# Patient Record
Sex: Female | Born: 1980
Health system: Southern US, Community
[De-identification: ages and names within clinical notes are randomized; demographics above are authoritative.]

---

## 2013-04-04 ENCOUNTER — Other Ambulatory Visit: Payer: Self-pay | Admitting: Chiropractic Medicine

## 2013-04-04 ENCOUNTER — Ambulatory Visit
Admission: RE | Admit: 2013-04-04 | Discharge: 2013-04-04 | Disposition: A | Discharge status: Home / Self Care | Payer: BC Managed Care – PPO | Source: Ambulatory Visit | Attending: Chiropractic Medicine | Admitting: Chiropractic Medicine

## 2013-04-04 DIAGNOSIS — M545 Low back pain, unspecified: Secondary | ICD-10-CM

## 2013-04-04 DIAGNOSIS — M5380 Other specified dorsopathies, site unspecified: Secondary | ICD-10-CM

## 2013-04-04 DIAGNOSIS — M542 Cervicalgia: Secondary | ICD-10-CM

## 2014-03-02 IMAGING — CR DG LUMBAR SPINE 2-3V
3 series · 3 of 3 positions shown · non-contrast
Comparison: None.

CLINICAL DATA: Left low back pain

LUMBAR SPINE - 2-3 VIEW

[view not recorded (1 of 3)]
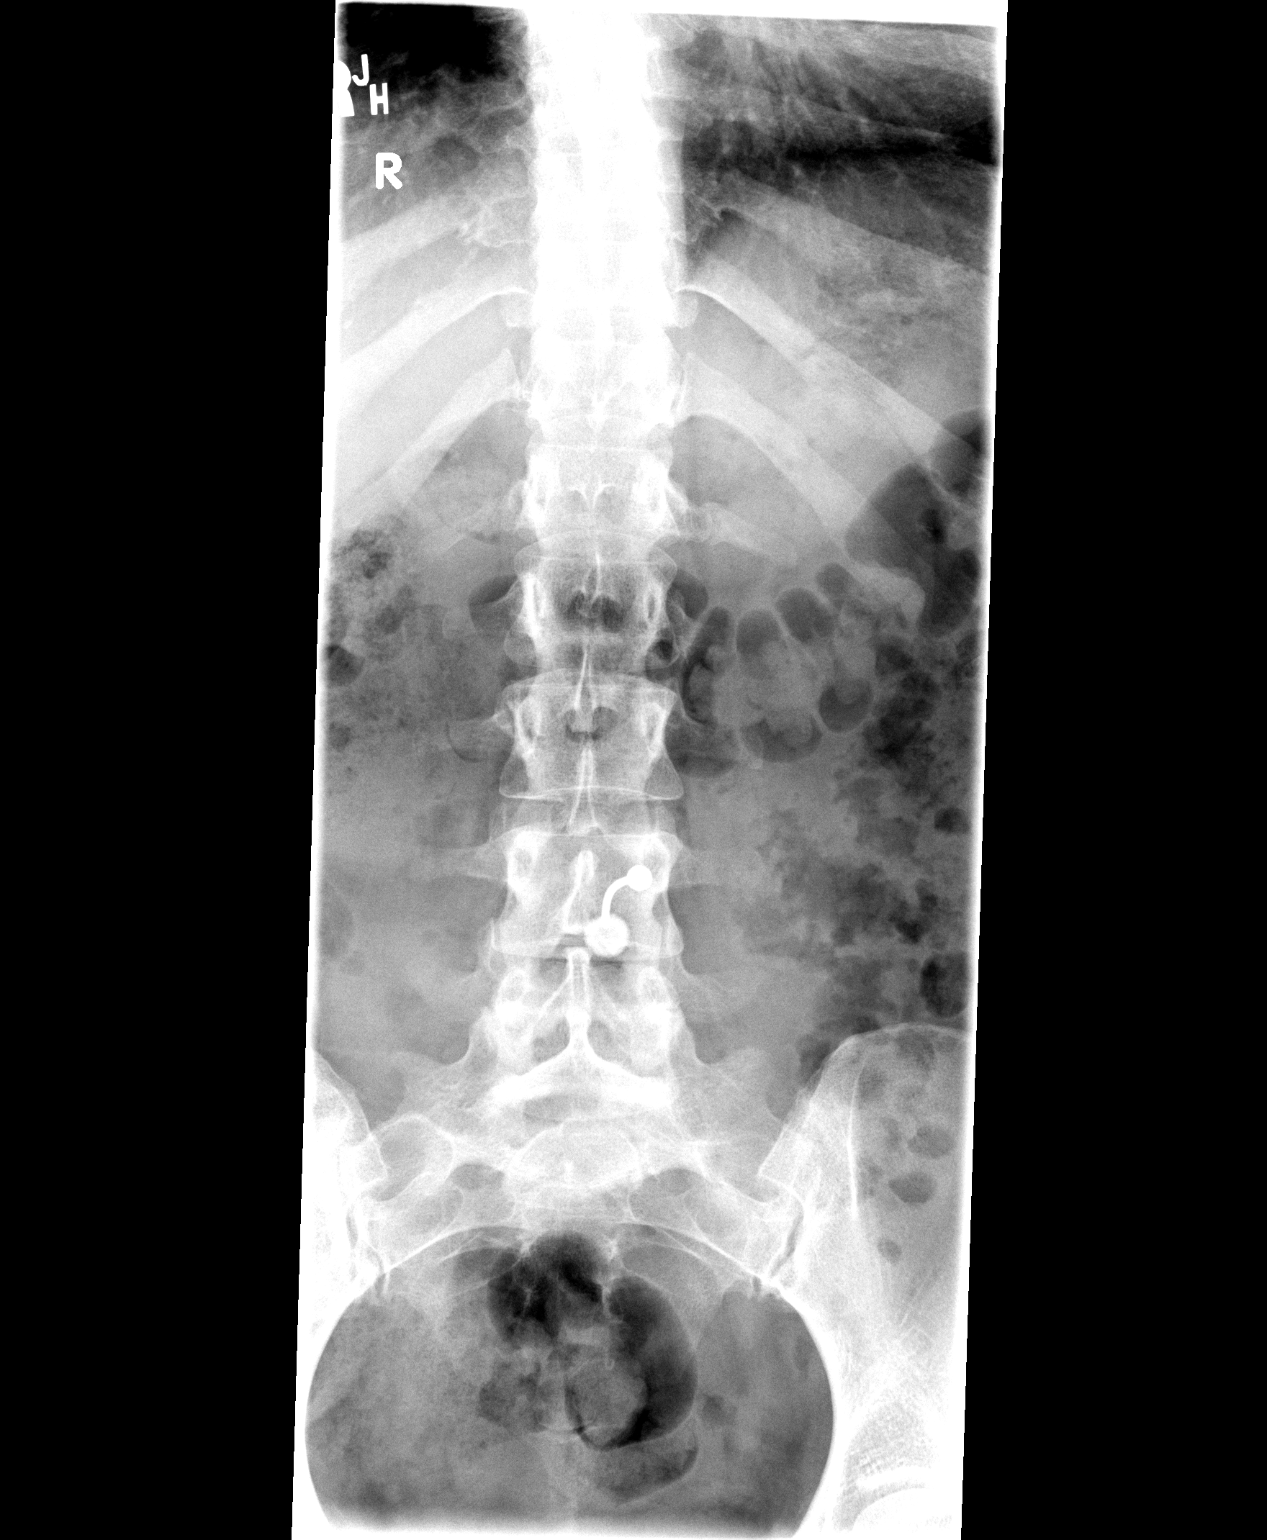

[view not recorded (2 of 3)]
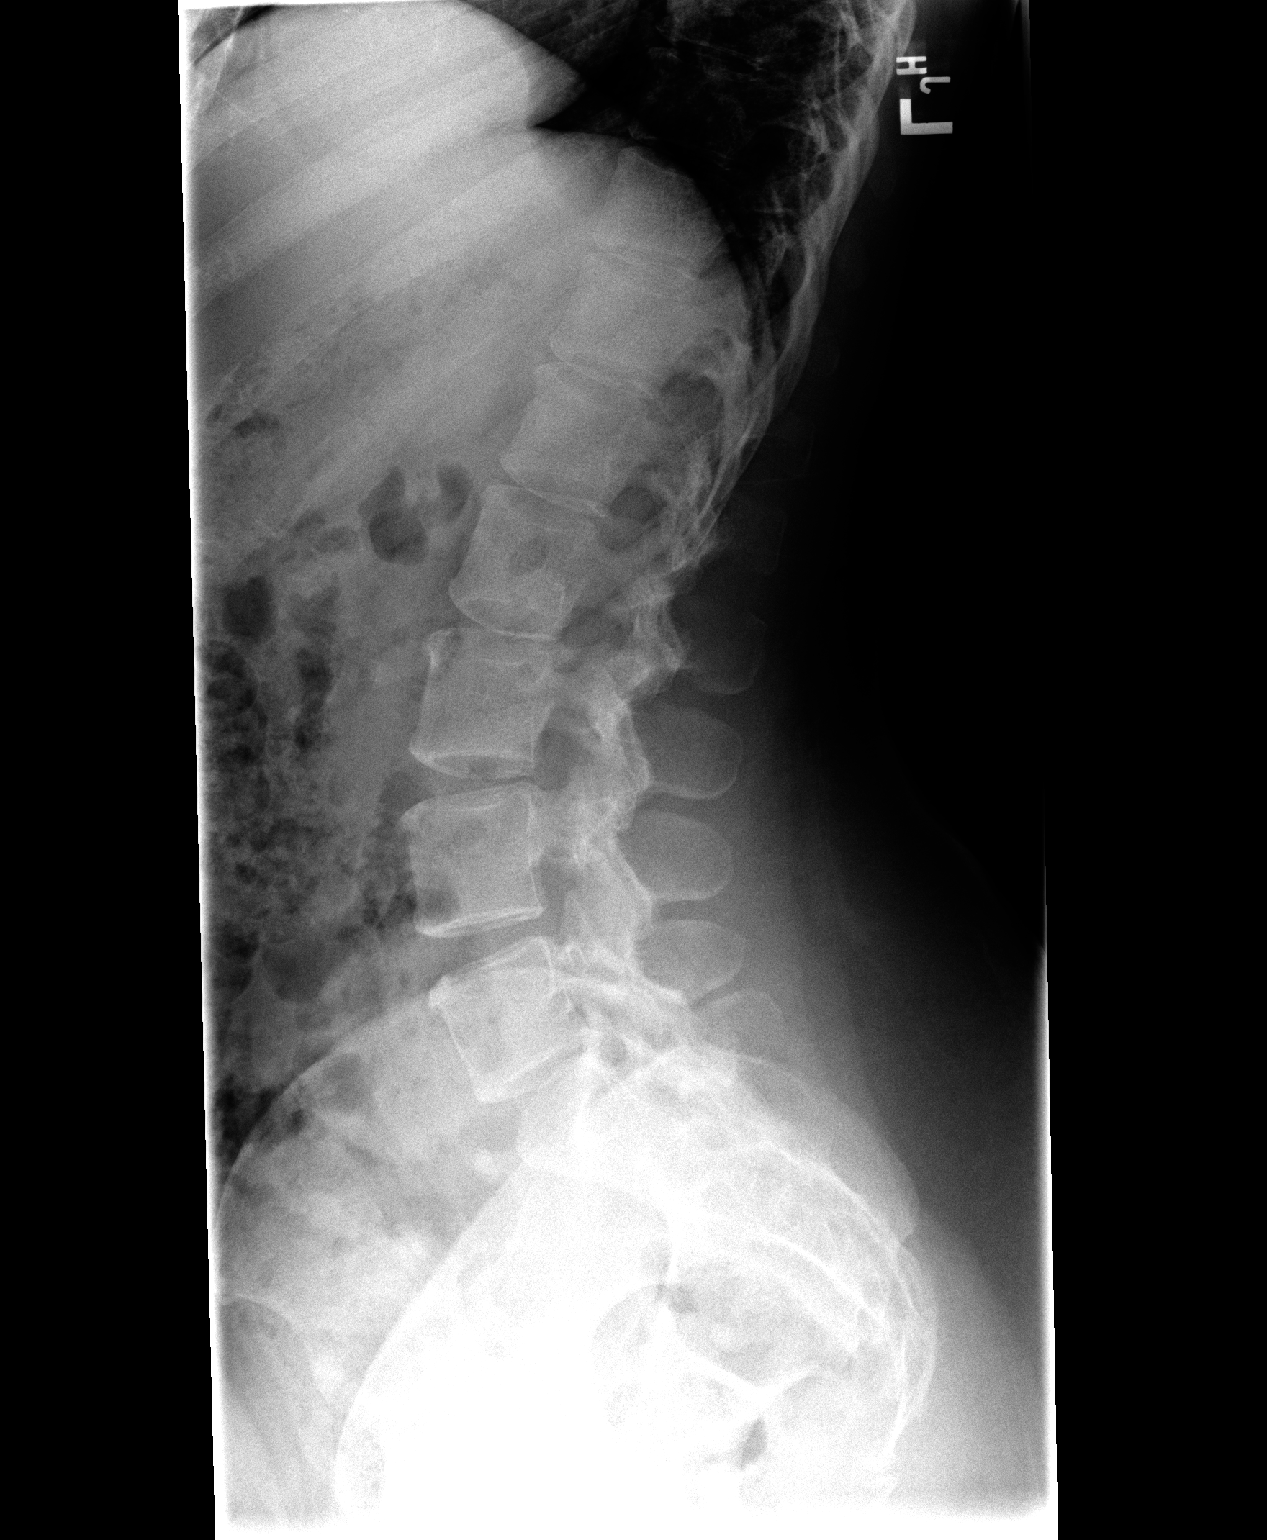

[view not recorded (3 of 3)]
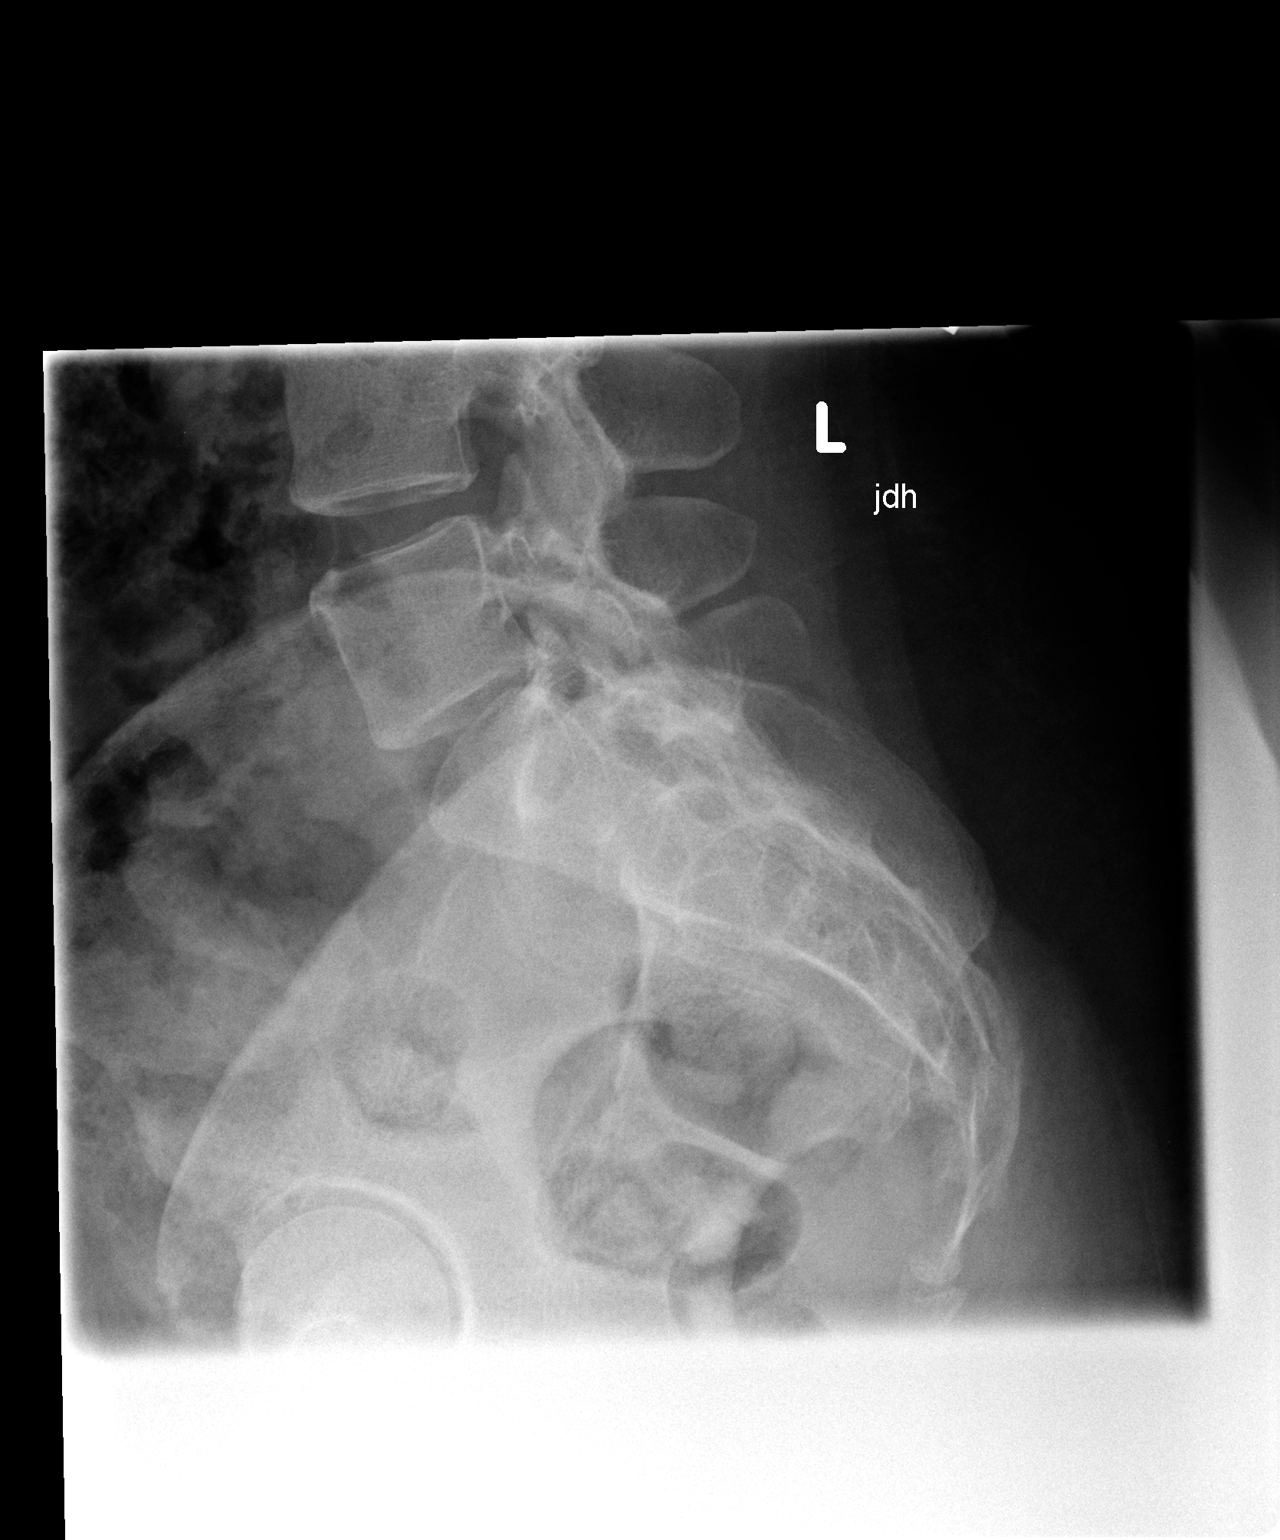

[3 of 3 positions shown; findings below may reference images not displayed]

FINDINGS: Alignment is normal.  There is slightly exaggerated
lordosis.  Disc space heights are normal.  No facet arthropathy is
evident.  Sacroiliac joints appear normal.
IMPRESSION: No pathologic finding.  Slightly exaggerated lordosis.

## 2016-06-06 DIAGNOSIS — Z713 Dietary counseling and surveillance: Secondary | ICD-10-CM | POA: Diagnosis not present

## 2016-06-24 DIAGNOSIS — E559 Vitamin D deficiency, unspecified: Secondary | ICD-10-CM | POA: Diagnosis not present

## 2016-07-03 DIAGNOSIS — Z713 Dietary counseling and surveillance: Secondary | ICD-10-CM | POA: Diagnosis not present

## 2016-07-08 DIAGNOSIS — G5603 Carpal tunnel syndrome, bilateral upper limbs: Secondary | ICD-10-CM | POA: Diagnosis not present

## 2016-07-08 DIAGNOSIS — J309 Allergic rhinitis, unspecified: Secondary | ICD-10-CM | POA: Diagnosis not present

## 2016-07-08 DIAGNOSIS — E559 Vitamin D deficiency, unspecified: Secondary | ICD-10-CM | POA: Diagnosis not present

## 2016-07-08 DIAGNOSIS — E663 Overweight: Secondary | ICD-10-CM | POA: Diagnosis not present

## 2016-08-12 DIAGNOSIS — Z6829 Body mass index (BMI) 29.0-29.9, adult: Secondary | ICD-10-CM | POA: Diagnosis not present

## 2016-08-12 DIAGNOSIS — Z01419 Encounter for gynecological examination (general) (routine) without abnormal findings: Secondary | ICD-10-CM | POA: Diagnosis not present

## 2016-12-10 DIAGNOSIS — Z0000 Encounter for general adult medical examination without abnormal findings: Secondary | ICD-10-CM | POA: Diagnosis not present

## 2016-12-10 DIAGNOSIS — Z136 Encounter for screening for cardiovascular disorders: Secondary | ICD-10-CM | POA: Diagnosis not present

## 2016-12-10 DIAGNOSIS — E559 Vitamin D deficiency, unspecified: Secondary | ICD-10-CM | POA: Diagnosis not present

## 2017-02-05 DIAGNOSIS — Z6831 Body mass index (BMI) 31.0-31.9, adult: Secondary | ICD-10-CM | POA: Diagnosis not present

## 2017-02-05 DIAGNOSIS — Z23 Encounter for immunization: Secondary | ICD-10-CM | POA: Diagnosis not present

## 2017-02-05 DIAGNOSIS — Z0000 Encounter for general adult medical examination without abnormal findings: Secondary | ICD-10-CM | POA: Diagnosis not present

## 2017-03-06 DIAGNOSIS — D224 Melanocytic nevi of scalp and neck: Secondary | ICD-10-CM | POA: Diagnosis not present

## 2017-03-06 DIAGNOSIS — D2261 Melanocytic nevi of right upper limb, including shoulder: Secondary | ICD-10-CM | POA: Diagnosis not present

## 2017-03-06 DIAGNOSIS — D2262 Melanocytic nevi of left upper limb, including shoulder: Secondary | ICD-10-CM | POA: Diagnosis not present

## 2017-03-06 DIAGNOSIS — D225 Melanocytic nevi of trunk: Secondary | ICD-10-CM | POA: Diagnosis not present

## 2017-08-12 DIAGNOSIS — Z23 Encounter for immunization: Secondary | ICD-10-CM | POA: Diagnosis not present

## 2017-08-27 DIAGNOSIS — Z23 Encounter for immunization: Secondary | ICD-10-CM | POA: Diagnosis not present

## 2017-10-26 DIAGNOSIS — Z6829 Body mass index (BMI) 29.0-29.9, adult: Secondary | ICD-10-CM | POA: Diagnosis not present

## 2017-10-26 DIAGNOSIS — Z01419 Encounter for gynecological examination (general) (routine) without abnormal findings: Secondary | ICD-10-CM | POA: Diagnosis not present

## 2018-01-29 ENCOUNTER — Other Ambulatory Visit: Payer: Self-pay

## 2018-01-29 ENCOUNTER — Ambulatory Visit (HOSPITAL_COMMUNITY)
Admission: EM | Admit: 2018-01-29 | Discharge: 2018-01-29 | Disposition: A | Discharge status: Home / Self Care | Payer: BLUE CROSS/BLUE SHIELD | Attending: Internal Medicine | Admitting: Internal Medicine

## 2018-01-29 ENCOUNTER — Encounter (HOSPITAL_COMMUNITY): Payer: Self-pay | Admitting: Emergency Medicine

## 2018-01-29 DIAGNOSIS — J029 Acute pharyngitis, unspecified: Secondary | ICD-10-CM | POA: Diagnosis not present

## 2018-01-29 DIAGNOSIS — Z91013 Allergy to seafood: Secondary | ICD-10-CM | POA: Diagnosis not present

## 2018-01-29 DIAGNOSIS — Z882 Allergy status to sulfonamides status: Secondary | ICD-10-CM | POA: Diagnosis not present

## 2018-01-29 LAB — POCT RAPID STREP A: Streptococcus, Group A Screen (Direct): NEGATIVE

## 2018-01-29 MED ORDER — CETIRIZINE-PSEUDOEPHEDRINE ER 5-120 MG PO TB12: 1.0000 | ORAL_TABLET | Freq: Every day | ORAL | 0 refills | Status: AC

## 2018-01-29 MED ORDER — EYE WASH OPHTH SOLN
OPHTHALMIC | Status: AC
  Filled 2018-01-29: qty 118

## 2018-01-29 MED ORDER — LIDOCAINE VISCOUS 2 % MT SOLN: 15.0000 mL | OROMUCOSAL | 0 refills | Status: AC | PRN

## 2018-01-29 MED ORDER — FLUTICASONE PROPIONATE 50 MCG/ACT NA SUSP: 2.0000 | Freq: Every day | NASAL | 0 refills | Status: AC

## 2018-01-29 NOTE — ED Triage Notes (Signed)
Pt c/o sore throat for a few days, painful swallowing.

## 2018-01-29 NOTE — ED Provider Notes (Signed)
[facility]    CSN: [id number] Arrival date & time: 01/29/18  1858     History   Chief Complaint Chief Complaint  Patient presents with  . Sore Throat    HPI Gina Davies is a 37 y.o. female.   37 year old female comes in for few day history of URI symptoms.  She has had nasal congestion, rhinorrhea, cough.  The symptoms are slowly resolving and though she is only had severe sore throat today with dysphagia.  Denies swelling of the throat, trouble breathing, drooling, tripoding.  Denies fever, chills, night sweats. Has tried OTC lozenges.      History reviewed. No pertinent past medical history.  There are no active problems to display for this patient.   History reviewed. No pertinent surgical history.  OB History    No data available       Home Medications    Prior to Admission medications   Medication Sig Start Date End Date Taking? Authorizing Provider  cetirizine-pseudoephedrine (ZYRTEC-D) 5-120 MG tablet Take 1 tablet by mouth daily. 01/29/18   Cathie Hoops, Amy V, PA-C  fluticasone (FLONASE) 50 MCG/ACT nasal spray Place 2 sprays into both nostrils daily. 01/29/18   Cathie Hoops, Amy V, PA-C  lidocaine (XYLOCAINE) 2 % solution Use as directed 15 mLs in the mouth or throat as needed for mouth pain. 01/29/18   Belinda Fisher, PA-C    Family History No family history on file.  Social History Social History   Tobacco Use  . Smoking status: Never Smoker  Substance Use Topics  . Alcohol use: Yes    Frequency: Never  . Drug use: No     Allergies   Shellfish allergy and Sulfa antibiotics   Review of Systems Review of Systems  Reason unable to perform ROS: See HPI as above.     Physical Exam Triage Vital Signs ED Triage Vitals [01/29/18 1925]  Enc Vitals Group     BP (!) 152/125     Pulse Rate 94     Resp 18     Temp 98 F (36.7 C)     Temp src      SpO2 100 %     Weight      Height      Head Circumference      Peak Flow      Pain Score      Pain  Loc      Pain Edu?      Excl. in GC?    No data found.  Updated Vital Signs BP (!) 152/125   Pulse 94   Temp 98 F (36.7 C)   Resp 18   LMP 12/29/2017   SpO2 100%   Physical Exam  Constitutional: She is oriented to person, place, and time. She appears well-developed and well-nourished. No distress.  HENT:  Head: Normocephalic and atraumatic.  Right Ear: Tympanic membrane, external ear and ear canal normal. Tympanic membrane is not erythematous and not bulging.  Left Ear: Tympanic membrane, external ear and ear canal normal. Tympanic membrane is not erythematous and not bulging.  Nose: Mucosal edema and rhinorrhea present. Right sinus exhibits maxillary sinus tenderness. Right sinus exhibits no frontal sinus tenderness. Left sinus exhibits maxillary sinus tenderness. Left sinus exhibits no frontal sinus tenderness.  Mouth/Throat: Uvula is midline and mucous membranes are normal. Posterior oropharyngeal erythema present. No tonsillar exudate.  Eyes: Conjunctivae are normal. Pupils are equal, round, and reactive to light.  Neck: Normal range of motion.  Neck supple.  Cardiovascular: Normal rate, regular rhythm and normal heart sounds. Exam reveals no gallop and no friction rub.  No murmur heard. Pulmonary/Chest: Effort normal and breath sounds normal. She has no decreased breath sounds. She has no wheezes. She has no rhonchi. She has no rales.  Lymphadenopathy:    She has no cervical adenopathy.  Neurological: She is alert and oriented to person, place, and time.  Skin: Skin is warm and dry.  Psychiatric: She has a normal mood and affect. Her behavior is normal. Judgment normal.    UC Treatments / Results  Labs (all labs ordered are listed, but only abnormal results are displayed) Labs Reviewed  CULTURE, GROUP A STREP [facility]  POCT RAPID STREP A    EKG  EKG Interpretation None       Radiology No results found.  Procedures Procedures (including critical care  time)  Medications Ordered in UC Medications - No data to display   Initial Impression / Assessment and Plan / UC Course  I have reviewed the triage vital signs and the nursing notes.  Pertinent labs & imaging results that were available during my care of the patient were reviewed by me and considered in my medical decision making (see chart for details).    Rapid strep negative. Symptomatic treatment as needed. Return precautions given.   Final Clinical Impressions(s) / UC Diagnoses   Final diagnoses:  Pharyngitis, unspecified etiology    ED Discharge Orders        Ordered    fluticasone (FLONASE) 50 MCG/ACT nasal spray  Daily     01/29/18 2009    cetirizine-pseudoephedrine (ZYRTEC-D) 5-120 MG tablet  Daily     01/29/18 2009    lidocaine (XYLOCAINE) 2 % solution  As needed     01/29/18 2009        Belinda FisherYu, Amy V, Cordelia Poche-C 01/29/18 2018

## 2018-01-29 NOTE — Discharge Instructions (Signed)
Rapid strep negative. Symptoms are most likely due to viral illness. Start lidocaine for sore throat.  Do not eat or drink for 30 minutes after lidocaine as it can stunt your gag reflex.  Flonase and/or Zyrtec-D for nasal congestion/drainage. You can use over the counter nasal saline rinse such as neti pot for nasal congestion. Monitor for any worsening of symptoms, swelling of the throat, trouble breathing, trouble swallowing, follow up for reevaluation.   For sore throat try using a honey-based tea. Use 3 teaspoons of honey with juice squeezed from half lemon. Place shaved pieces of ginger into 1/2-1 cup of water and warm over stove top. Then mix the ingredients and repeat every 4 hours as needed.

## 2018-02-01 LAB — CULTURE, GROUP A STREP (THRC)

## 2018-02-12 DIAGNOSIS — Z114 Encounter for screening for human immunodeficiency virus [HIV]: Secondary | ICD-10-CM | POA: Diagnosis not present

## 2018-02-12 DIAGNOSIS — Z0000 Encounter for general adult medical examination without abnormal findings: Secondary | ICD-10-CM | POA: Diagnosis not present

## 2018-02-12 DIAGNOSIS — Z13220 Encounter for screening for lipoid disorders: Secondary | ICD-10-CM | POA: Diagnosis not present

## 2018-02-12 DIAGNOSIS — Z1329 Encounter for screening for other suspected endocrine disorder: Secondary | ICD-10-CM | POA: Diagnosis not present

## 2018-02-25 DIAGNOSIS — Z0000 Encounter for general adult medical examination without abnormal findings: Secondary | ICD-10-CM | POA: Diagnosis not present

## 2018-02-25 DIAGNOSIS — Z6828 Body mass index (BMI) 28.0-28.9, adult: Secondary | ICD-10-CM | POA: Diagnosis not present

## 2018-03-03 DIAGNOSIS — J111 Influenza due to unidentified influenza virus with other respiratory manifestations: Secondary | ICD-10-CM | POA: Diagnosis not present

## 2018-08-24 DIAGNOSIS — Z13220 Encounter for screening for lipoid disorders: Secondary | ICD-10-CM | POA: Diagnosis not present

## 2018-08-24 DIAGNOSIS — E559 Vitamin D deficiency, unspecified: Secondary | ICD-10-CM | POA: Diagnosis not present

## 2018-08-26 DIAGNOSIS — E785 Hyperlipidemia, unspecified: Secondary | ICD-10-CM | POA: Diagnosis not present

## 2018-08-26 DIAGNOSIS — J309 Allergic rhinitis, unspecified: Secondary | ICD-10-CM | POA: Diagnosis not present

## 2018-08-26 DIAGNOSIS — E559 Vitamin D deficiency, unspecified: Secondary | ICD-10-CM | POA: Diagnosis not present

## 2018-08-26 DIAGNOSIS — Z23 Encounter for immunization: Secondary | ICD-10-CM | POA: Diagnosis not present

## 2018-08-26 DIAGNOSIS — L731 Pseudofolliculitis barbae: Secondary | ICD-10-CM | POA: Diagnosis not present

## 2019-02-17 DIAGNOSIS — M629 Disorder of muscle, unspecified: Secondary | ICD-10-CM | POA: Diagnosis not present

## 2019-02-17 DIAGNOSIS — Z6830 Body mass index (BMI) 30.0-30.9, adult: Secondary | ICD-10-CM | POA: Diagnosis not present

## 2019-02-17 DIAGNOSIS — Z01419 Encounter for gynecological examination (general) (routine) without abnormal findings: Secondary | ICD-10-CM | POA: Diagnosis not present

## 2019-02-25 DIAGNOSIS — E559 Vitamin D deficiency, unspecified: Secondary | ICD-10-CM | POA: Diagnosis not present

## 2019-02-25 DIAGNOSIS — Z0000 Encounter for general adult medical examination without abnormal findings: Secondary | ICD-10-CM | POA: Diagnosis not present

## 2019-02-25 DIAGNOSIS — Z114 Encounter for screening for human immunodeficiency virus [HIV]: Secondary | ICD-10-CM | POA: Diagnosis not present

## 2019-02-25 DIAGNOSIS — Z13220 Encounter for screening for lipoid disorders: Secondary | ICD-10-CM | POA: Diagnosis not present

## 2019-02-25 DIAGNOSIS — Z1329 Encounter for screening for other suspected endocrine disorder: Secondary | ICD-10-CM | POA: Diagnosis not present

## 2019-03-01 DIAGNOSIS — Z0000 Encounter for general adult medical examination without abnormal findings: Secondary | ICD-10-CM | POA: Diagnosis not present

## 2019-03-01 DIAGNOSIS — Z6830 Body mass index (BMI) 30.0-30.9, adult: Secondary | ICD-10-CM | POA: Diagnosis not present

## 2019-10-11 DIAGNOSIS — L814 Other melanin hyperpigmentation: Secondary | ICD-10-CM | POA: Diagnosis not present

## 2019-10-11 DIAGNOSIS — D2261 Melanocytic nevi of right upper limb, including shoulder: Secondary | ICD-10-CM | POA: Diagnosis not present

## 2019-10-11 DIAGNOSIS — D2362 Other benign neoplasm of skin of left upper limb, including shoulder: Secondary | ICD-10-CM | POA: Diagnosis not present

## 2019-10-11 DIAGNOSIS — D485 Neoplasm of uncertain behavior of skin: Secondary | ICD-10-CM | POA: Diagnosis not present

## 2019-10-11 DIAGNOSIS — D225 Melanocytic nevi of trunk: Secondary | ICD-10-CM | POA: Diagnosis not present

## 2019-10-12 DIAGNOSIS — Z23 Encounter for immunization: Secondary | ICD-10-CM | POA: Diagnosis not present

## 2020-02-06 ENCOUNTER — Other Ambulatory Visit: Payer: Self-pay

## 2020-02-06 DIAGNOSIS — Z03818 Encounter for observation for suspected exposure to other biological agents ruled out: Secondary | ICD-10-CM | POA: Diagnosis not present

## 2020-02-06 DIAGNOSIS — Z20828 Contact with and (suspected) exposure to other viral communicable diseases: Secondary | ICD-10-CM | POA: Diagnosis not present

## 2020-04-16 DIAGNOSIS — Z20828 Contact with and (suspected) exposure to other viral communicable diseases: Secondary | ICD-10-CM | POA: Diagnosis not present

## 2020-04-16 DIAGNOSIS — Z0184 Encounter for antibody response examination: Secondary | ICD-10-CM | POA: Diagnosis not present

## 2020-04-16 DIAGNOSIS — E559 Vitamin D deficiency, unspecified: Secondary | ICD-10-CM | POA: Diagnosis not present

## 2020-04-16 DIAGNOSIS — Z0000 Encounter for general adult medical examination without abnormal findings: Secondary | ICD-10-CM | POA: Diagnosis not present

## 2020-04-18 DIAGNOSIS — L2081 Atopic neurodermatitis: Secondary | ICD-10-CM | POA: Diagnosis not present

## 2020-04-18 DIAGNOSIS — R030 Elevated blood-pressure reading, without diagnosis of hypertension: Secondary | ICD-10-CM | POA: Diagnosis not present

## 2020-04-18 DIAGNOSIS — Z733 Stress, not elsewhere classified: Secondary | ICD-10-CM | POA: Diagnosis not present

## 2020-04-18 DIAGNOSIS — Z6828 Body mass index (BMI) 28.0-28.9, adult: Secondary | ICD-10-CM | POA: Diagnosis not present

## 2020-04-18 DIAGNOSIS — Z0000 Encounter for general adult medical examination without abnormal findings: Secondary | ICD-10-CM | POA: Diagnosis not present

## 2020-04-18 DIAGNOSIS — J309 Allergic rhinitis, unspecified: Secondary | ICD-10-CM | POA: Diagnosis not present

## 2020-04-24 DIAGNOSIS — Z01419 Encounter for gynecological examination (general) (routine) without abnormal findings: Secondary | ICD-10-CM | POA: Diagnosis not present

## 2020-04-24 DIAGNOSIS — M629 Disorder of muscle, unspecified: Secondary | ICD-10-CM | POA: Diagnosis not present

## 2020-04-24 DIAGNOSIS — Z6827 Body mass index (BMI) 27.0-27.9, adult: Secondary | ICD-10-CM | POA: Diagnosis not present

## 2020-04-30 DIAGNOSIS — J309 Allergic rhinitis, unspecified: Secondary | ICD-10-CM | POA: Diagnosis not present

## 2020-04-30 DIAGNOSIS — H109 Unspecified conjunctivitis: Secondary | ICD-10-CM | POA: Diagnosis not present

## 2020-04-30 DIAGNOSIS — H1013 Acute atopic conjunctivitis, bilateral: Secondary | ICD-10-CM | POA: Diagnosis not present

## 2020-05-31 DIAGNOSIS — R030 Elevated blood-pressure reading, without diagnosis of hypertension: Secondary | ICD-10-CM | POA: Diagnosis not present

## 2020-05-31 DIAGNOSIS — F411 Generalized anxiety disorder: Secondary | ICD-10-CM | POA: Diagnosis not present

## 2020-05-31 DIAGNOSIS — G4700 Insomnia, unspecified: Secondary | ICD-10-CM | POA: Diagnosis not present

## 2020-05-31 DIAGNOSIS — L280 Lichen simplex chronicus: Secondary | ICD-10-CM | POA: Diagnosis not present

## 2020-07-12 DIAGNOSIS — M6289 Other specified disorders of muscle: Secondary | ICD-10-CM | POA: Diagnosis not present

## 2020-07-12 DIAGNOSIS — M62838 Other muscle spasm: Secondary | ICD-10-CM | POA: Diagnosis not present

## 2020-07-12 DIAGNOSIS — M6281 Muscle weakness (generalized): Secondary | ICD-10-CM | POA: Diagnosis not present

## 2020-07-29 DIAGNOSIS — Z20822 Contact with and (suspected) exposure to covid-19: Secondary | ICD-10-CM | POA: Diagnosis not present

## 2020-07-29 DIAGNOSIS — Z03818 Encounter for observation for suspected exposure to other biological agents ruled out: Secondary | ICD-10-CM | POA: Diagnosis not present

## 2020-08-08 DIAGNOSIS — N393 Stress incontinence (female) (male): Secondary | ICD-10-CM | POA: Diagnosis not present

## 2020-08-08 DIAGNOSIS — F419 Anxiety disorder, unspecified: Secondary | ICD-10-CM | POA: Diagnosis not present

## 2020-08-08 DIAGNOSIS — I10 Essential (primary) hypertension: Secondary | ICD-10-CM | POA: Diagnosis not present

## 2020-08-08 DIAGNOSIS — F329 Major depressive disorder, single episode, unspecified: Secondary | ICD-10-CM | POA: Diagnosis not present

## 2020-08-14 DIAGNOSIS — G4700 Insomnia, unspecified: Secondary | ICD-10-CM | POA: Diagnosis not present

## 2020-08-14 DIAGNOSIS — F331 Major depressive disorder, recurrent, moderate: Secondary | ICD-10-CM | POA: Diagnosis not present

## 2020-08-14 DIAGNOSIS — I10 Essential (primary) hypertension: Secondary | ICD-10-CM | POA: Diagnosis not present

## 2020-08-14 DIAGNOSIS — F411 Generalized anxiety disorder: Secondary | ICD-10-CM | POA: Diagnosis not present

## 2020-10-02 DIAGNOSIS — F331 Major depressive disorder, recurrent, moderate: Secondary | ICD-10-CM | POA: Diagnosis not present

## 2020-10-02 DIAGNOSIS — G4700 Insomnia, unspecified: Secondary | ICD-10-CM | POA: Diagnosis not present

## 2020-10-02 DIAGNOSIS — F411 Generalized anxiety disorder: Secondary | ICD-10-CM | POA: Diagnosis not present

## 2020-10-16 DIAGNOSIS — D225 Melanocytic nevi of trunk: Secondary | ICD-10-CM | POA: Diagnosis not present

## 2020-10-16 DIAGNOSIS — D2261 Melanocytic nevi of right upper limb, including shoulder: Secondary | ICD-10-CM | POA: Diagnosis not present

## 2020-10-16 DIAGNOSIS — D224 Melanocytic nevi of scalp and neck: Secondary | ICD-10-CM | POA: Diagnosis not present

## 2020-10-16 DIAGNOSIS — L814 Other melanin hyperpigmentation: Secondary | ICD-10-CM | POA: Diagnosis not present

## 2020-11-30 DIAGNOSIS — J029 Acute pharyngitis, unspecified: Secondary | ICD-10-CM | POA: Diagnosis not present

## 2020-12-31 DIAGNOSIS — U071 COVID-19: Secondary | ICD-10-CM | POA: Diagnosis not present

## 2020-12-31 DIAGNOSIS — Z03818 Encounter for observation for suspected exposure to other biological agents ruled out: Secondary | ICD-10-CM | POA: Diagnosis not present

## 2020-12-31 DIAGNOSIS — Z7189 Other specified counseling: Secondary | ICD-10-CM | POA: Diagnosis not present

## 2020-12-31 DIAGNOSIS — J029 Acute pharyngitis, unspecified: Secondary | ICD-10-CM | POA: Diagnosis not present

## 2021-01-02 ENCOUNTER — Other Ambulatory Visit: Payer: Self-pay

## 2021-06-10 ENCOUNTER — Encounter: Payer: Self-pay | Admitting: Emergency Medicine

## 2021-06-10 ENCOUNTER — Ambulatory Visit
Admission: EM | Admit: 2021-06-10 | Discharge: 2021-06-10 | Disposition: A | Discharge status: Home / Self Care | Payer: BC Managed Care – PPO | Attending: Emergency Medicine | Admitting: Emergency Medicine

## 2021-06-10 ENCOUNTER — Other Ambulatory Visit: Payer: Self-pay

## 2021-06-10 DIAGNOSIS — J069 Acute upper respiratory infection, unspecified: Secondary | ICD-10-CM

## 2021-06-10 LAB — POCT RAPID STREP A (OFFICE): Rapid Strep A Screen: NEGATIVE

## 2021-06-10 NOTE — ED Provider Notes (Signed)
[facility]    CSN: [id number] Arrival date & time: 06/10/21  1258      History   Chief Complaint Chief Complaint  Patient presents with   Fever   Cough   Nasal Congestion    HPI Gina Davies is a 40 y.o. female.  Patient presents with 3-day history of fever, congestion, ear pain, sore throat, cough.  She reports recent exposure to strep throat.  She denies rash, shortness of breath, vomiting, diarrhea, or other symptoms.  Negative at home COVID test x2.  Treatment at home with DayQuil and NyQuil.  No pertinent medical history per patient.  The history is provided by the patient.   History reviewed. No pertinent past medical history.  There are no problems to display for this patient.   History reviewed. No pertinent surgical history.  OB History   No obstetric history on file.      Home Medications    Prior to Admission medications   Medication Sig Start Date End Date Taking? Authorizing Provider  fexofenadine (ALLEGRA) 180 MG tablet Take 180 mg by mouth.   Yes [provider]  LO LOESTRIN FE 1 MG-10 MCG / 10 MCG tablet Take 1 tablet by mouth daily. 05/13/21  Yes [provider]  montelukast (SINGULAIR) 10 MG tablet 10 mg. 04/30/20  Yes [provider]  Multiple Vitamin (MULTIVITAMIN WITH MINERALS) TABS tablet Take 1 tablet by mouth daily.   Yes [provider]  sertraline (ZOLOFT) 50 MG tablet Take 1 tablet by mouth every morning. 05/19/21  Yes [provider]  cetirizine-pseudoephedrine (ZYRTEC-D) 5-120 MG tablet Take 1 tablet by mouth daily. 01/29/18   Cathie Hoops, Amy V, PA-C  fluticasone (FLONASE) 50 MCG/ACT nasal spray Place 2 sprays into both nostrils daily. 01/29/18   Cathie Hoops, Amy V, PA-C  lidocaine (XYLOCAINE) 2 % solution Use as directed 15 mLs in the mouth or throat as needed for mouth pain. 01/29/18   Belinda Fisher, PA-C    Family History History reviewed. No pertinent family history.  Social History Social History    Tobacco Use   Smoking status: Never   Smokeless tobacco: Never  Substance Use Topics   Alcohol use: Yes   Drug use: No     Allergies   Shellfish allergy and Sulfa antibiotics   Review of Systems Review of Systems  Constitutional:  Positive for fever. Negative for chills.  HENT:  Positive for congestion, ear pain and sore throat.   Respiratory:  Positive for cough. Negative for shortness of breath.   Cardiovascular:  Negative for chest pain and palpitations.  Gastrointestinal:  Negative for abdominal pain, diarrhea and vomiting.  Skin:  Negative for color change and rash.  All other systems reviewed and are negative.   Physical Exam Triage Vital Signs ED Triage Vitals  Enc Vitals Group     BP      Pulse      Resp      Temp      Temp src      SpO2      Weight      Height      Head Circumference      Peak Flow      Pain Score      Pain Loc      Pain Edu?      Excl. in GC?    No data found.  Updated Vital Signs BP 136/90 (BP Location: Left Arm)   Pulse (!) 103  Temp 98.4 F (36.9 C) (Oral)   Resp 18   LMP  (LMP Unknown)   SpO2 97%   Visual Acuity Right Eye Distance:   Left Eye Distance:   Bilateral Distance:    Right Eye Near:   Left Eye Near:    Bilateral Near:     Physical Exam Vitals and nursing note reviewed.  Constitutional:      General: She is not in acute distress.    Appearance: She is well-developed.  HENT:     Head: Normocephalic and atraumatic.     Right Ear: Tympanic membrane normal.     Left Ear: Tympanic membrane normal.     Nose: Rhinorrhea present.     Mouth/Throat:     Mouth: Mucous membranes are moist.     Pharynx: Oropharynx is clear.  Eyes:     Conjunctiva/sclera: Conjunctivae normal.  Cardiovascular:     Rate and Rhythm: Normal rate and regular rhythm.     Heart sounds: Normal heart sounds.  Pulmonary:     Effort: Pulmonary effort is normal. No respiratory distress.     Breath sounds: Normal breath sounds.   Abdominal:     Palpations: Abdomen is soft.     Tenderness: There is no abdominal tenderness.  Musculoskeletal:     Cervical back: Neck supple.  Skin:    General: Skin is warm and dry.  Neurological:     General: No focal deficit present.     Mental Status: She is alert and oriented to person, place, and time.     Gait: Gait normal.  Psychiatric:        Mood and Affect: Mood normal.        Behavior: Behavior normal.     UC Treatments / Results  Labs (all labs ordered are listed, but only abnormal results are displayed) Labs Reviewed  COVID-19, FLU A+B NAA  CULTURE, GROUP A STREP [facility])  POCT RAPID STREP A (OFFICE)    EKG   Radiology No results found.  Procedures Procedures (including critical care time)  Medications Ordered in UC Medications - No data to display  Initial Impression / Assessment and Plan / UC Course  I have reviewed the triage vital signs and the nursing notes.  Pertinent labs & imaging results that were available during my care of the patient were reviewed by me and considered in my medical decision making (see chart for details).  Viral URI.  Rapid strep negative; culture pending.  Influenza and COVID pending.  Instructed patient to self quarantine per CDC guidelines.  Discussed symptomatic treatment including Tylenol or ibuprofen, rest, hydration.  Instructed patient to follow up with PCP if her symptoms are not improving.  Patient agrees to plan of care.    Final Clinical Impressions(s) / UC Diagnoses   Final diagnoses:  Viral URI     Discharge Instructions      Your rapid strep test is negative.  A throat culture is pending; we will call you if it is positive requiring treatment.    Your COVID and Influenza tests are pending.  You should self quarantine until the test results are back.    Take Tylenol or ibuprofen as needed for fever or discomfort.  Rest and keep yourself hydrated.    Follow-up with your primary care provider if  your symptoms are not improving.         ED Prescriptions   None    PDMP not reviewed this encounter.   Wendee Beavers  H, NP 06/10/21 1351

## 2021-06-10 NOTE — ED Triage Notes (Addendum)
Patient c/o fever, productive cough w/ "greenish" sputum, and nasal congestion x 3 days.   Patient endorses a temperature of 99.6 F at home.   Patient endorses bilateral ear pressure and sore throat with painful swallowing.    Patient denies SOB or chest pain.   Patient has taken 2 at home COVID-19 test with negative results per patient statement.   Patient has taken Dayquil and Nyquil with some relief of symptoms.

## 2021-06-10 NOTE — Discharge Instructions (Addendum)
Your rapid strep test is negative.  A throat culture is pending; we will call you if it is positive requiring treatment.    Your COVID and Influenza tests are pending.  You should self quarantine until the test results are back.    Take Tylenol or ibuprofen as needed for fever or discomfort.  Rest and keep yourself hydrated.    Follow-up with your primary care provider if your symptoms are not improving.     

## 2021-06-12 LAB — COVID-19, FLU A+B NAA
Influenza A, NAA: NOT DETECTED
Influenza B, NAA: NOT DETECTED
SARS-CoV-2, NAA: NOT DETECTED

## 2021-06-12 LAB — CULTURE, GROUP A STREP (THRC)

## 2021-06-20 DIAGNOSIS — B9689 Other specified bacterial agents as the cause of diseases classified elsewhere: Secondary | ICD-10-CM | POA: Diagnosis not present

## 2021-06-20 DIAGNOSIS — J069 Acute upper respiratory infection, unspecified: Secondary | ICD-10-CM | POA: Diagnosis not present

## 2021-06-20 DIAGNOSIS — J329 Chronic sinusitis, unspecified: Secondary | ICD-10-CM | POA: Diagnosis not present

## 2022-04-15 ENCOUNTER — Ambulatory Visit: Payer: BC Managed Care – PPO | Admitting: Family Medicine

## 2022-04-15 ENCOUNTER — Other Ambulatory Visit: Payer: Self-pay | Admitting: Family Medicine

## 2022-04-15 DIAGNOSIS — E6609 Other obesity due to excess calories: Secondary | ICD-10-CM

## 2022-04-15 MED ORDER — SEMAGLUTIDE-WEIGHT MANAGEMENT 0.25 MG/0.5ML ~~LOC~~ SOAJ: 0.2500 mg | SUBCUTANEOUS | 1 refills | Status: AC

## 2022-04-15 NOTE — Telephone Encounter (Signed)
Pt has not received med. Resent. ?

## 2024-08-30 ENCOUNTER — Other Ambulatory Visit: Payer: Self-pay | Admitting: Obstetrics and Gynecology

## 2024-08-30 DIAGNOSIS — R928 Other abnormal and inconclusive findings on diagnostic imaging of breast: Secondary | ICD-10-CM

## 2024-09-15 ENCOUNTER — Other Ambulatory Visit: Payer: Self-pay | Admitting: Obstetrics and Gynecology

## 2024-09-15 ENCOUNTER — Encounter: Payer: Self-pay | Admitting: Obstetrics and Gynecology

## 2024-09-15 ENCOUNTER — Ambulatory Visit
Admission: RE | Admit: 2024-09-15 | Discharge: 2024-09-15 | Disposition: A | Discharge status: Home / Self Care | Source: Ambulatory Visit | Attending: Obstetrics and Gynecology | Admitting: Obstetrics and Gynecology

## 2024-09-15 DIAGNOSIS — R921 Mammographic calcification found on diagnostic imaging of breast: Secondary | ICD-10-CM

## 2024-09-15 DIAGNOSIS — R928 Other abnormal and inconclusive findings on diagnostic imaging of breast: Secondary | ICD-10-CM

## 2024-10-03 ENCOUNTER — Ambulatory Visit
Admission: RE | Admit: 2024-10-03 | Discharge: 2024-10-03 | Disposition: A | Source: Ambulatory Visit | Attending: Obstetrics and Gynecology | Admitting: Obstetrics and Gynecology

## 2024-10-03 ENCOUNTER — Ambulatory Visit
Admission: RE | Admit: 2024-10-03 | Discharge: 2024-10-03 | Disposition: A | Discharge status: Home / Self Care | Source: Ambulatory Visit | Attending: Obstetrics and Gynecology | Admitting: Obstetrics and Gynecology

## 2024-10-03 DIAGNOSIS — R921 Mammographic calcification found on diagnostic imaging of breast: Secondary | ICD-10-CM

## 2024-10-03 HISTORY — PX: BREAST BIOPSY: SHX20

## 2024-10-05 LAB — SURGICAL PATHOLOGY
